# Patient Record
Sex: Male | Born: 1950 | Race: White | Hispanic: No | Marital: Married
Health system: Midwestern US, Community
[De-identification: ages and names within clinical notes are randomized; demographics above are authoritative.]

## PROBLEM LIST (undated history)

## (undated) DIAGNOSIS — K635 Polyp of colon: Secondary | ICD-10-CM

## (undated) DIAGNOSIS — K5790 Diverticulosis of intestine, part unspecified, without perforation or abscess without bleeding: Secondary | ICD-10-CM

## (undated) DIAGNOSIS — R945 Abnormal results of liver function studies: Secondary | ICD-10-CM

## (undated) DIAGNOSIS — E119 Type 2 diabetes mellitus without complications: Secondary | ICD-10-CM

## (undated) DIAGNOSIS — E785 Hyperlipidemia, unspecified: Secondary | ICD-10-CM

## (undated) DIAGNOSIS — E1136 Type 2 diabetes mellitus with diabetic cataract: Principal | ICD-10-CM

## (undated) DIAGNOSIS — H2512 Age-related nuclear cataract, left eye: Secondary | ICD-10-CM

## (undated) HISTORY — PX: TONSILLECTOMY: SUR1361

---

## 2000-07-10 HISTORY — PX: COLONOSCOPY: SHX174

## 2002-08-19 HISTORY — PX: COLONOSCOPY: SHX174

## 2006-07-02 ENCOUNTER — Ambulatory Visit: Payer: Self-pay | Admitting: Internal Medicine

## 2007-11-26 ENCOUNTER — Ambulatory Visit: Payer: Self-pay | Admitting: Gastroenterology

## 2008-05-18 IMAGING — US ABDOMEN ULTRASOUND
1 series · 17 of 25 positions shown · non-contrast
Comparison: none

REASON FOR EXAM: abnormal LFT
COMMENTS:

[Series 1: abdomen ultrasound · 17 of 51 slices shown]
[im 1/51]
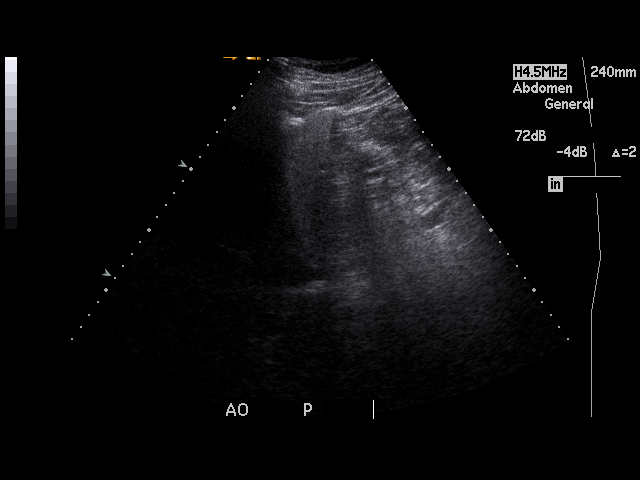
[im 5/51]
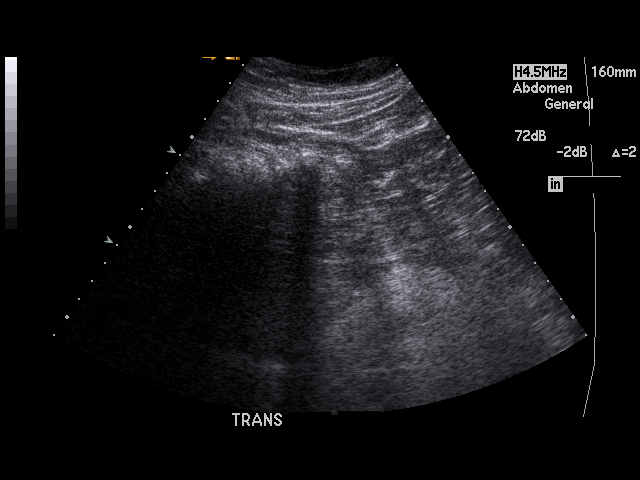
[im 7/51]
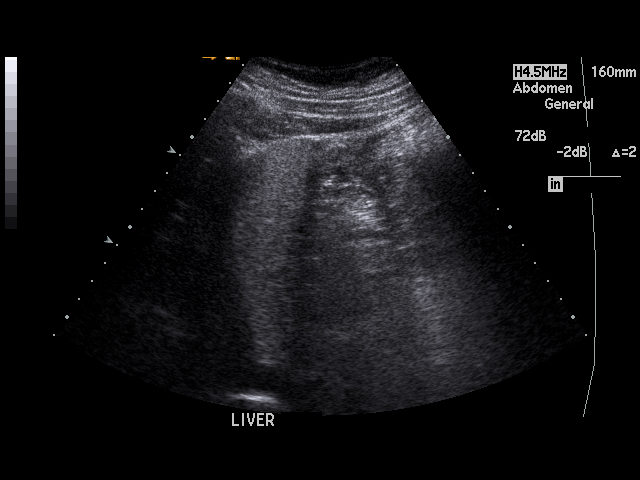
[im 11/51]
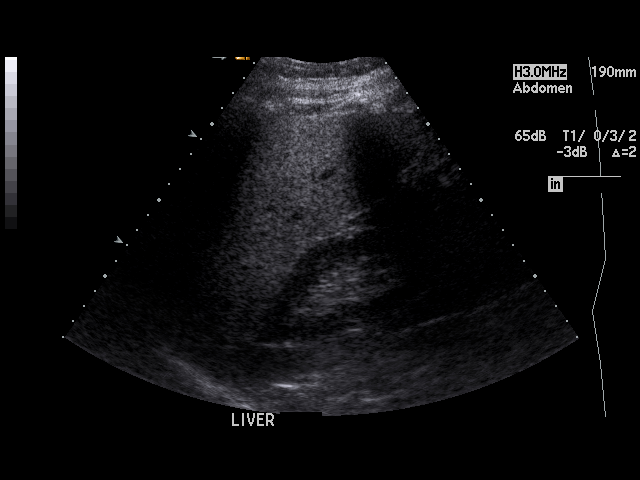
[im 13/51]
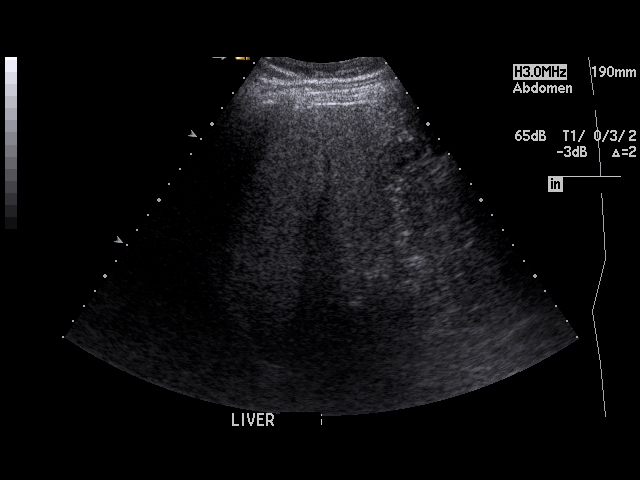
[im 17/51]
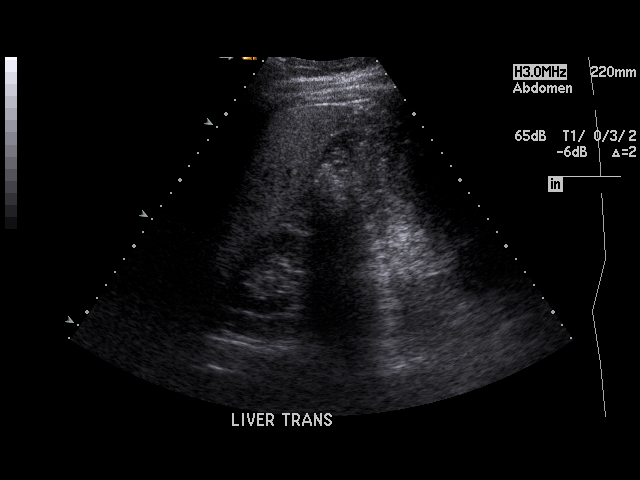
[im 19/51]
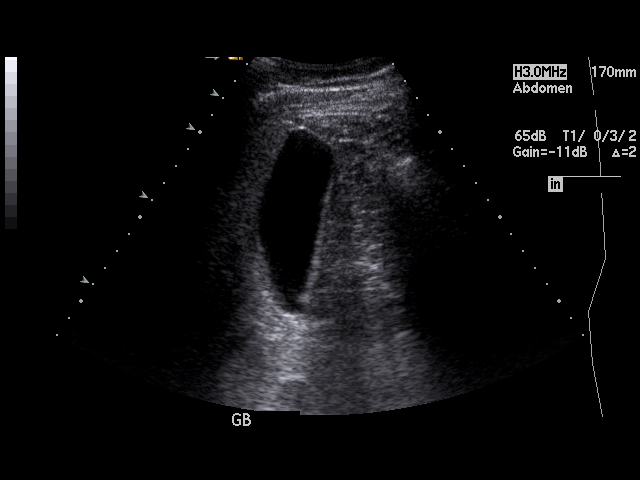
[im 23/51]
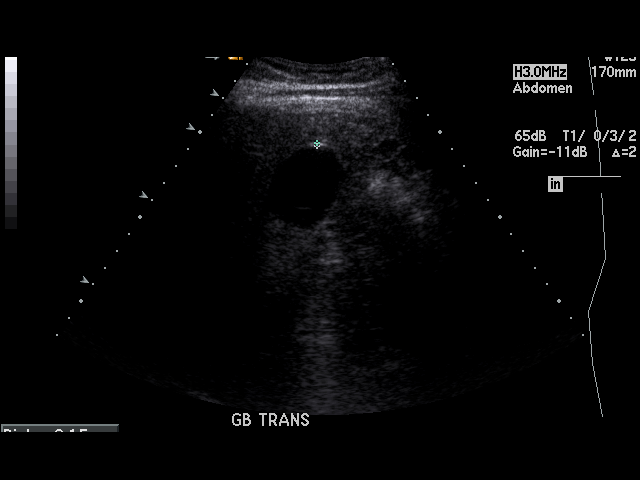
[im 26/51]
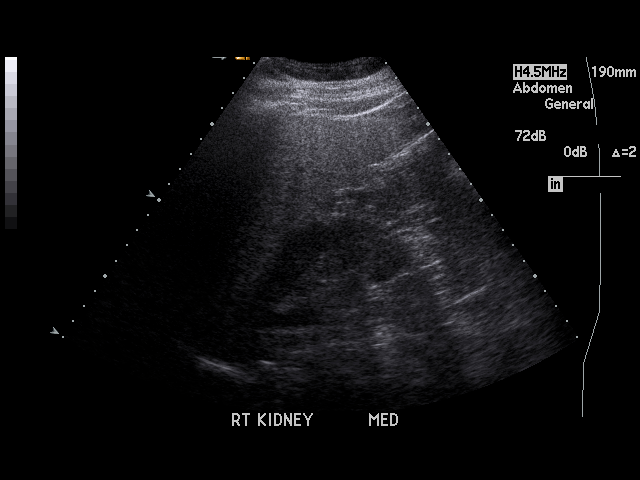
[im 28/51]
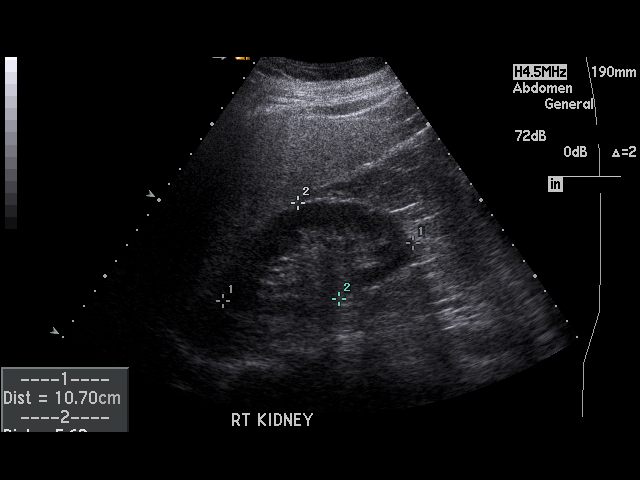
[im 32/51]
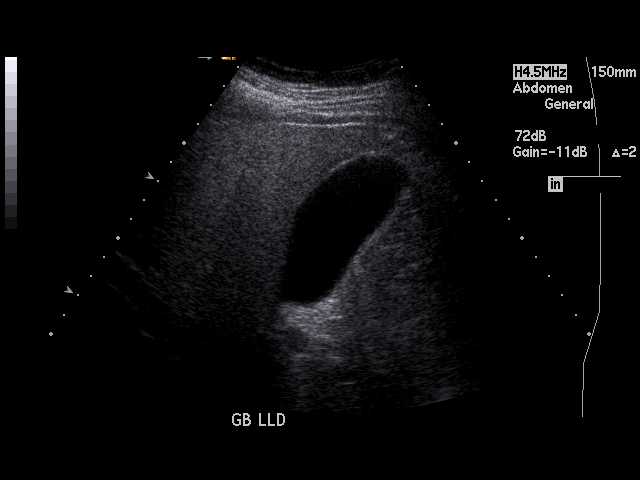
[im 34/51]
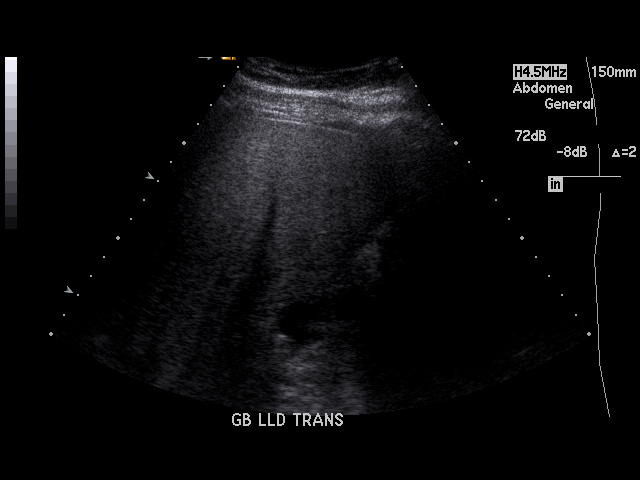
[im 38/51]
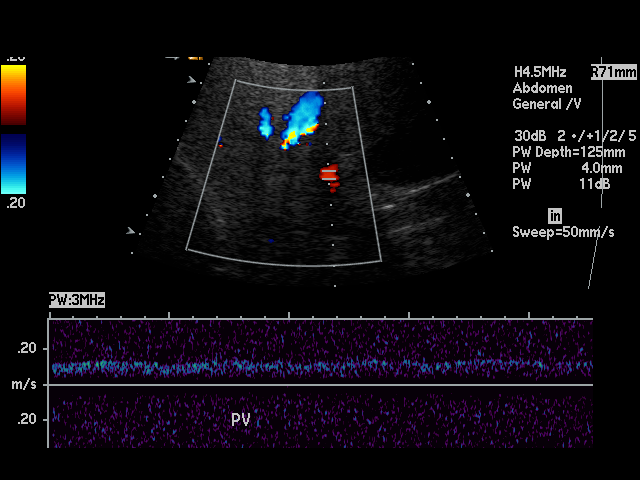
[im 40/51]
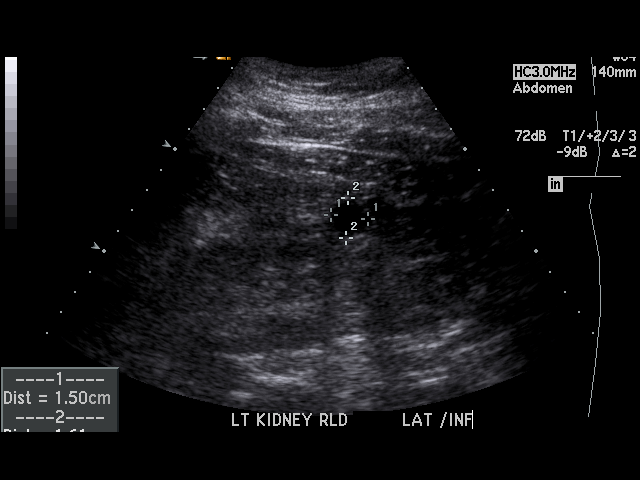
[im 44/51]
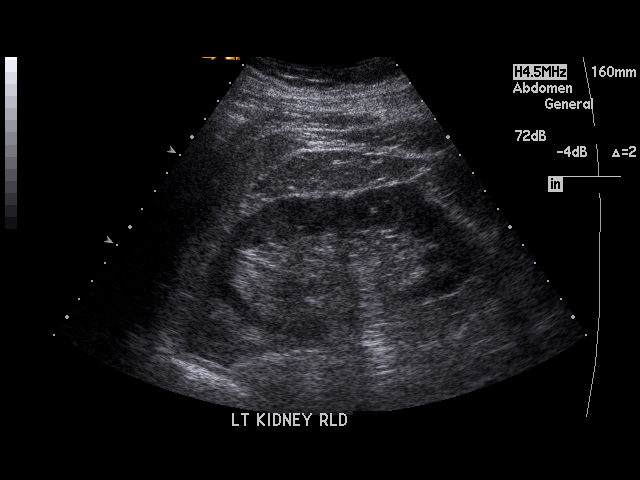
[im 46/51]
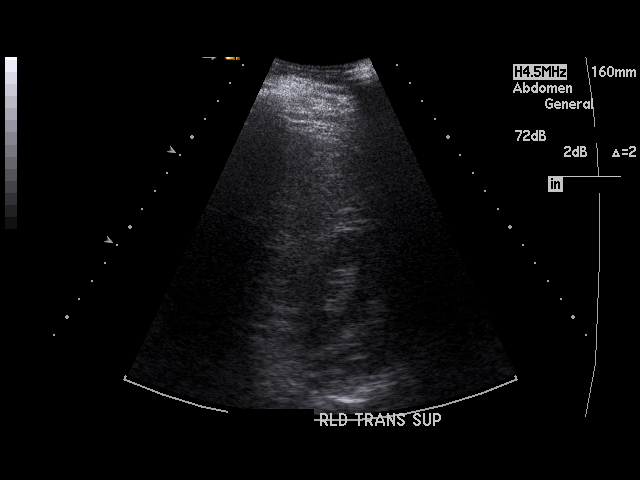
[im 51/51]
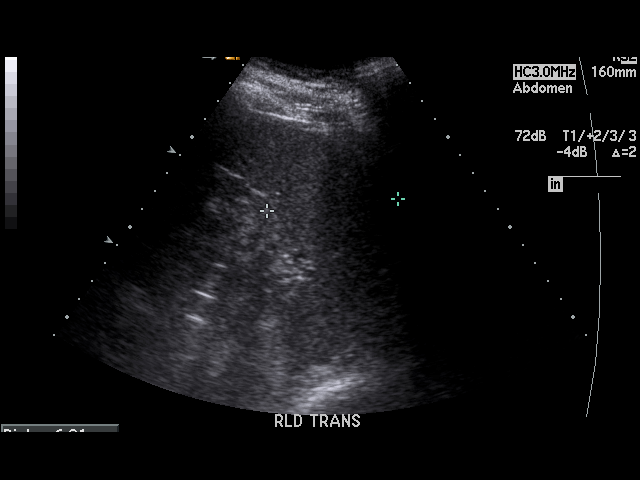

[17 of 25 positions shown; findings below may reference images not displayed]

PROCEDURE:     US  - US ABDOMEN GENERAL SURVEY  - July 02, 2006  [DATE]

RESULT:     There is fatty replacement of the liver.  The pancreas is not
visualized.  The gallbladder is unremarkable.  The common bile duct wall is
normal in caliber at 1.5 mm.  The common bile duct measures 5.8 mm.  The
abdominal aorta is normal. There is no hydronephrosis.  A simple cyst
measuring 1.6 cm is noted in the LEFT kidney.
IMPRESSION: 1)Diffuse echogenicity of the liver.  This could be seen with fatty
replacement.  Hepatocellular disease could also present in this fashion.

## 2018-06-01 ENCOUNTER — Encounter: Payer: Self-pay | Admitting: *Deleted

## 2018-06-02 ENCOUNTER — Ambulatory Visit: Payer: Medicare Other | Admitting: Certified Registered"

## 2018-06-02 ENCOUNTER — Ambulatory Visit
Admission: RE | Admit: 2018-06-02 | Discharge: 2018-06-02 | Disposition: A | Discharge status: Home / Self Care | Payer: Medicare Other | Attending: Gastroenterology | Admitting: Gastroenterology

## 2018-06-02 ENCOUNTER — Encounter
Admission: RE | Disposition: A | Discharge status: Home / Self Care | Payer: Self-pay | Source: Home / Self Care | Attending: Gastroenterology

## 2018-06-02 DIAGNOSIS — Z888 Allergy status to other drugs, medicaments and biological substances status: Secondary | ICD-10-CM | POA: Insufficient documentation

## 2018-06-02 DIAGNOSIS — Z7984 Long term (current) use of oral hypoglycemic drugs: Secondary | ICD-10-CM | POA: Diagnosis not present

## 2018-06-02 DIAGNOSIS — Z7982 Long term (current) use of aspirin: Secondary | ICD-10-CM | POA: Diagnosis not present

## 2018-06-02 DIAGNOSIS — K5730 Diverticulosis of large intestine without perforation or abscess without bleeding: Secondary | ICD-10-CM | POA: Diagnosis not present

## 2018-06-02 DIAGNOSIS — K640 First degree hemorrhoids: Secondary | ICD-10-CM | POA: Insufficient documentation

## 2018-06-02 DIAGNOSIS — Z1211 Encounter for screening for malignant neoplasm of colon: Secondary | ICD-10-CM | POA: Diagnosis not present

## 2018-06-02 DIAGNOSIS — D122 Benign neoplasm of ascending colon: Secondary | ICD-10-CM | POA: Diagnosis not present

## 2018-06-02 DIAGNOSIS — E119 Type 2 diabetes mellitus without complications: Secondary | ICD-10-CM | POA: Insufficient documentation

## 2018-06-02 DIAGNOSIS — K635 Polyp of colon: Secondary | ICD-10-CM | POA: Insufficient documentation

## 2018-06-02 DIAGNOSIS — D123 Benign neoplasm of transverse colon: Secondary | ICD-10-CM | POA: Diagnosis not present

## 2018-06-02 DIAGNOSIS — E785 Hyperlipidemia, unspecified: Secondary | ICD-10-CM | POA: Diagnosis not present

## 2018-06-02 DIAGNOSIS — Z79899 Other long term (current) drug therapy: Secondary | ICD-10-CM | POA: Insufficient documentation

## 2018-06-02 DIAGNOSIS — Z86010 Personal history of colonic polyps: Secondary | ICD-10-CM | POA: Diagnosis not present

## 2018-06-02 HISTORY — DX: Type 2 diabetes mellitus without complications: E11.9

## 2018-06-02 HISTORY — DX: Diverticulosis of intestine, part unspecified, without perforation or abscess without bleeding: K57.90

## 2018-06-02 HISTORY — DX: Hyperlipidemia, unspecified: E78.5

## 2018-06-02 HISTORY — DX: Abnormal results of liver function studies: R94.5

## 2018-06-02 HISTORY — DX: Polyp of colon: K63.5

## 2018-06-02 HISTORY — PX: COLONOSCOPY WITH PROPOFOL: SHX5780

## 2018-06-02 LAB — GLUCOSE, CAPILLARY: Glucose-Capillary: 240 mg/dL — ABNORMAL HIGH (ref 70–99)

## 2018-06-02 SURGERY — COLONOSCOPY WITH PROPOFOL
Anesthesia: General

## 2018-06-02 MED ORDER — PROPOFOL 500 MG/50ML IV EMUL
INTRAVENOUS | Status: DC | PRN
  Administered 2018-06-02: 155 ug/kg/min via INTRAVENOUS

## 2018-06-02 MED ORDER — PROPOFOL 10 MG/ML IV BOLUS
INTRAVENOUS | Status: DC | PRN
  Administered 2018-06-02 (×2): 50 mg via INTRAVENOUS

## 2018-06-02 MED ORDER — SODIUM CHLORIDE 0.9 % IV SOLN
INTRAVENOUS | Status: DC
  Administered 2018-06-02: 1000 mL via INTRAVENOUS

## 2018-06-02 MED ORDER — PROPOFOL 500 MG/50ML IV EMUL
INTRAVENOUS | Status: AC
  Filled 2018-06-02: qty 50

## 2018-06-02 MED ORDER — PROPOFOL 10 MG/ML IV BOLUS
INTRAVENOUS | Status: AC
  Filled 2018-06-02: qty 20

## 2018-06-02 NOTE — Anesthesia Post-op Follow-up Note (Signed)
Anesthesia QCDR form completed.        

## 2018-06-02 NOTE — H&P (Signed)
Outpatient short stay form Pre-procedure 06/02/2018 10:25 AM Lollie Sails MD  Primary Physician: Dr. Emily Filbert  Reason for visit: Colonoscopy  History of present illness: Patient is a 67 year old male presenting today for colonoscopy.  His last procedure was 01/14/2013 with removal of several polyps at that time.  He is presenting today for for a repeat colonoscopy.  He tolerated his prep.  He takes no aspirin or blood thinning agent within the past several weeks.    Current Facility-Administered Medications:  .  0.9 %  sodium chloride infusion, , Intravenous, Continuous, Lollie Sails, MD, Last Rate: 20 mL/hr at 06/02/18 1005, 1,000 mL at 06/02/18 1005  Medications Prior to Admission  Medication Sig Dispense Refill Last Dose  . ALPRAZolam (XANAX) 0.25 MG tablet Take 0.25 mg by mouth as needed for anxiety.   Past Week at Unknown time  . aspirin 325 MG tablet Take 325 mg by mouth as needed.   Past Week at Unknown time  . glimepiride (AMARYL) 2 MG tablet Take 2 mg by mouth daily with breakfast.   Past Week at Unknown time  . lisinopril (PRINIVIL,ZESTRIL) 20 MG tablet Take 20 mg by mouth daily.   Past Week at Unknown time  . Multiple Vitamins-Minerals (CENTRUM SILVER PO) Take by mouth daily.   Past Week at Unknown time  . Omega-3 Fatty Acids (OMEGA-3 FISH OIL PO) Take by mouth daily.   Past Week at Unknown time  . dapagliflozin propanediol (FARXIGA) 10 MG TABS tablet Take 10 mg by mouth daily.   Not Taking at Unknown time     Allergies  Allergen Reactions  . Metformin And Related     Muscle pain   . Statins     Muscle pain      Past Medical History:  Diagnosis Date  . Abnormal LFTs   . Colon polyp    tubular adenoma  . Diabetes mellitus without complication ([place])   . Diverticulosis   . Hyperlipidemia     Review of systems:      Physical Exam    Heart and lungs: Regular rate and rhythm without rub or gallop, lungs are bilaterally clear.    HEENT:  Normocephalic atraumatic eyes are anicteric    Other:    Pertinant exam for procedure: Soft nontender nondistended bowel sounds positive normoactive    Planned proceedures: Colonoscopy and indicated procedures. I have discussed the risks benefits and complications of procedures to include not limited to bleeding, infection, perforation and the risk of sedation and the patient wishes to proceed.    Lollie Sails, MD Gastroenterology 06/02/2018  10:25 AM

## 2018-06-02 NOTE — Anesthesia Postprocedure Evaluation (Signed)
Anesthesia Post Note  Patient: George Scott.  Procedure(s) Performed: COLONOSCOPY WITH PROPOFOL (N/A )  Patient location during evaluation: Endoscopy Anesthesia Type: General Level of consciousness: awake and alert and oriented Pain management: pain level controlled Vital Signs Assessment: post-procedure vital signs reviewed and stable Respiratory status: spontaneous breathing, nonlabored ventilation and respiratory function stable Cardiovascular status: blood pressure returned to baseline and stable Postop Assessment: no signs of nausea or vomiting Anesthetic complications: no     Last Vitals:  Vitals:   06/02/18 1111 06/02/18 1121  BP: 110/64 125/70  Pulse: 64 (!) 59  Resp: 16 12  Temp: (!) 36.2 C   SpO2: 99% 95%    Last Pain:  Vitals:   06/02/18 1121  TempSrc:   PainSc: 0-No pain                 George Scott

## 2018-06-02 NOTE — Transfer of Care (Signed)
Immediate Anesthesia Transfer of Care Note  Patient: George Scott.  Procedure(s) Performed: COLONOSCOPY WITH PROPOFOL (N/A )  Patient Location: PACU  Anesthesia Type:General  Level of Consciousness: awake, alert  and oriented  Airway & Oxygen Therapy: Patient Spontanous Breathing and Patient connected to nasal cannula oxygen  Post-op Assessment: Report given to RN  Post vital signs: Reviewed and stable  Last Vitals:  Vitals Value Taken Time  BP    Temp    Pulse 65 06/02/2018 11:12 AM  Resp 14 06/02/2018 11:12 AM  SpO2 98 % 06/02/2018 11:12 AM  Vitals shown include unvalidated device data.  Last Pain:  Vitals:   06/02/18 0948  TempSrc: Tympanic  PainSc: 0-No pain         Complications: No apparent anesthesia complications

## 2018-06-02 NOTE — Anesthesia Preprocedure Evaluation (Addendum)
Anesthesia Evaluation  Patient identified by MRN, date of birth, ID band Patient awake    Reviewed: Allergy & Precautions, NPO status , Patient's Chart, lab work & pertinent test results  History of Anesthesia Complications Negative for: history of anesthetic complications  Airway Mallampati: II  TM Distance: >3 FB Neck ROM: Full    Dental no notable dental hx.    Pulmonary neg sleep apnea, neg COPD, former smoker,    breath sounds clear to auscultation- rhonchi (-) wheezing      Cardiovascular Exercise Tolerance: Good hypertension, Pt. on medications (-) CAD, (-) Past MI, (-) Cardiac Stents and (-) CABG  Rhythm:Regular Rate:Normal - Systolic murmurs and - Diastolic murmurs    Neuro/Psych neg Seizures negative neurological ROS  negative psych ROS   GI/Hepatic negative GI ROS, Neg liver ROS,   Endo/Other  diabetes, Oral Hypoglycemic Agents  Renal/GU negative Renal ROS     Musculoskeletal negative musculoskeletal ROS (+)   Abdominal (+) - obese,   Peds  Hematology negative hematology ROS (+)   Anesthesia Other Findings Past Medical History: No date: Abnormal LFTs No date: Colon polyp     Comment:  tubular adenoma No date: Diabetes mellitus without complication ([facility]) No date: Diverticulosis No date: Hyperlipidemia   Reproductive/Obstetrics                             Anesthesia Physical Anesthesia Plan  ASA: II  Anesthesia Plan: General   Post-op Pain Management:    Induction: Intravenous  PONV Risk Score and Plan: 1 and Propofol infusion  Airway Management Planned: Natural Airway  Additional Equipment:   Intra-op Plan:   Post-operative Plan:   Informed Consent: I have reviewed the patients History and Physical, chart, labs and discussed the procedure including the risks, benefits and alternatives for the proposed anesthesia with the patient or authorized representative  who has indicated his/her understanding and acceptance.   Dental advisory given  Plan Discussed with: CRNA and Anesthesiologist  Anesthesia Plan Comments:        Anesthesia Quick Evaluation

## 2018-06-02 NOTE — Op Note (Signed)
[facility] Gastroenterology Patient Name: George Scott Procedure Date: 06/02/2018 10:28 AM MRN: [id number] Account #: [id number] Date of Birth: [birth year] Admit Type: Outpatient Age: 67 Room: [facility] ENDO ROOM 3 Gender: Male Note Status: Finalized Procedure:            Colonoscopy Indications:          Personal history of colonic polyps Providers:            Lollie Sails, MD Referring MD:         Rusty Aus, MD (Referring MD) Medicines:            Monitored Anesthesia Care Complications:        No immediate complications. Procedure:            Pre-Anesthesia Assessment:                       - ASA Grade Assessment: II - A patient with mild                        systemic disease.                       After obtaining informed consent, the colonoscope was                        passed under direct vision. Throughout the procedure,                        the patient's blood pressure, pulse, and oxygen                        saturations were monitored continuously. The                        Colonoscope was introduced through the anus and                        advanced to the the terminal ileum. The colonoscopy was                        performed without difficulty. The patient tolerated the                        procedure well. The quality of the bowel preparation                        was good. Findings:      Many medium-mouthed diverticula were found in the sigmoid colon,       descending colon, transverse colon and ascending colon.      A 2 mm polyp was found in the distal sigmoid colon. The polyp was       sessile. The polyp was removed with a cold biopsy forceps. Resection and       retrieval were complete.      A 3 mm polyp was found in the ascending colon. The polyp was sessile.       The polyp was removed with a cold biopsy forceps. Resection and       retrieval were complete.      A 2 mm polyp was found in the distal transverse colon. The  polyp was  sessile. The polyp was removed with a cold biopsy forceps. Resection and       retrieval were complete.      Non-bleeding internal hemorrhoids were found during retroflexion. The       hemorrhoids were small and Grade I (internal hemorrhoids that do not       prolapse).      The digital rectal exam was normal. Impression:           - Diverticulosis in the sigmoid colon, in the                        descending colon, in the transverse colon and in the                        ascending colon.                       - One 2 mm polyp in the distal sigmoid colon, removed                        with a cold biopsy forceps. Resected and retrieved.                       - One 3 mm polyp in the ascending colon, removed with a                        cold biopsy forceps. Resected and retrieved.                       - One 2 mm polyp in the distal transverse colon,                        removed with a cold biopsy forceps. Resected and                        retrieved.                       - Non-bleeding internal hemorrhoids. Recommendation:       - Discharge patient to home.                       - Soft diet today, then advance as tolerated to advance                        diet as tolerated.                       - Telephone GI clinic for pathology results in 1 week. Procedure Code(s):    --- Professional ---                       [id number], Colonoscopy, flexible; with biopsy, single or                        multiple Diagnosis Code(s):    --- Professional ---                       K64.0, First degree hemorrhoids  D12.5, Benign neoplasm of sigmoid colon                       D12.2, Benign neoplasm of ascending colon                       D12.3, Benign neoplasm of transverse colon (hepatic                        flexure or splenic flexure)                       Z86.010, Personal history of colonic polyps                       K57.30, Diverticulosis of large  intestine without                        perforation or abscess without bleeding CPT copyright 2018 American Medical Association. All rights reserved. The codes documented in this report are preliminary and upon coder review may  be revised to meet current compliance requirements. Lollie Sails, MD 06/02/2018 11:07:52 AM This report has been signed electronically. Number of Addenda: 0 Note Initiated On: 06/02/2018 10:28 AM Scope Withdrawal Time: 0 hours 10 minutes 32 seconds  Total Procedure Duration: 0 hours 19 minutes 32 seconds       [facility]

## 2018-06-04 ENCOUNTER — Encounter: Payer: Self-pay | Admitting: Gastroenterology

## 2018-06-04 LAB — SURGICAL PATHOLOGY

## 2018-10-20 ENCOUNTER — Other Ambulatory Visit: Payer: Self-pay | Admitting: Gastroenterology

## 2018-10-20 DIAGNOSIS — R748 Abnormal levels of other serum enzymes: Secondary | ICD-10-CM

## 2018-10-27 ENCOUNTER — Ambulatory Visit
Admission: RE | Admit: 2018-10-27 | Discharge: 2018-10-27 | Disposition: A | Discharge status: Home / Self Care | Payer: Medicare Other | Source: Ambulatory Visit | Attending: Gastroenterology | Admitting: Gastroenterology

## 2018-10-27 ENCOUNTER — Other Ambulatory Visit: Payer: Self-pay

## 2018-10-27 DIAGNOSIS — R748 Abnormal levels of other serum enzymes: Secondary | ICD-10-CM | POA: Diagnosis present

## 2019-06-23 ENCOUNTER — Ambulatory Visit: Payer: Medicare Other

## 2019-11-10 ENCOUNTER — Ambulatory Visit: Payer: Medicare Other | Admitting: Urology

## 2019-11-10 ENCOUNTER — Other Ambulatory Visit: Payer: Self-pay

## 2019-11-10 ENCOUNTER — Encounter: Payer: Self-pay | Admitting: Urology

## 2019-11-10 VITALS — BP 117/69 | HR 68 | Ht 68.0 in | Wt 176.0 lb

## 2019-11-10 DIAGNOSIS — N529 Male erectile dysfunction, unspecified: Secondary | ICD-10-CM

## 2019-11-10 MED ORDER — TADALAFIL 10 MG PO TABS: 10.0000 mg | ORAL_TABLET | Freq: Every day | ORAL | 11 refills | Status: AC

## 2019-11-10 NOTE — Patient Instructions (Addendum)
Tadalafil tablets (Cialis) What is this medicine? TADALAFIL (tah DA la fil) is used to treat erection problems in men. It is also used for enlargement of the prostate gland in men, a condition called benign prostatic hyperplasia or BPH. This medicine improves urine flow and reduces BPH symptoms. This medicine can also treat both erection problems and BPH when they occur together. This medicine may be used for other purposes; ask your health care provider or pharmacist if you have questions. COMMON BRAND NAME(S): Adcirca, ALYQ, Cialis What should I tell my health care provider before I take this medicine? They need to know if you have any of these conditions:  bleeding disorders  eye or vision problems, including a rare inherited eye disease called retinitis pigmentosa  anatomical deformation of the penis, Peyronie's disease, or history of priapism (painful and prolonged erection)  heart disease, angina, a history of heart attack, irregular heart beats, or other heart problems  high or low blood pressure  history of blood diseases, like sickle cell anemia or leukemia  history of stomach bleeding  kidney disease  liver disease  stroke  an unusual or allergic reaction to tadalafil, other medicines, foods, dyes, or preservatives  pregnant or trying to get pregnant  breast-feeding How should I use this medicine? Take this medicine by mouth with a glass of water. Follow the directions on the prescription label. You may take this medicine with or without meals. When this medicine is used for erection problems, your doctor may prescribe it to be taken once daily or as needed. If you are taking the medicine as needed, you may be able to have sexual activity 30 minutes after taking it and for up to 36 hours after taking it. Whether you are taking the medicine as needed or once daily, you should not take more than one dose per day. If you are taking this medicine for symptoms of benign  prostatic hyperplasia (BPH) or to treat both BPH and an erection problem, take the dose once daily at about the same time each day. Do not take your medicine more often than directed. Talk to your pediatrician regarding the use of this medicine in children. Special care may be needed. Overdosage: If you think you have taken too much of this medicine contact a poison control center or emergency room at once. NOTE: This medicine is only for you. Do not share this medicine with others. What if I miss a dose? If you are taking this medicine as needed for erection problems, this does not apply. If you miss a dose while taking this medicine once daily for an erection problem, benign prostatic hyperplasia, or both, take it as soon as you remember, but do not take more than one dose per day. What may interact with this medicine? Do not take this medicine with any of the following medications:  nitrates like amyl nitrite, isosorbide dinitrate, isosorbide mononitrate, nitroglycerin  other medicines for erectile dysfunction like avanafil, sildenafil, vardenafil  other tadalafil products (Adcirca)  riociguat This medicine may also interact with the following medications:  certain drugs for high blood pressure  certain drugs for the treatment of HIV infection or AIDS  certain drugs used for fungal or yeast infections, like fluconazole, itraconazole, ketoconazole, and voriconazole  certain drugs used for seizures like carbamazepine, phenytoin, and phenobarbital  grapefruit juice  macrolide antibiotics like clarithromycin, erythromycin, troleandomycin  medicines for prostate problems  rifabutin, rifampin or rifapentine This list may not describe all possible interactions. Give your   health care provider a list of all the medicines, herbs, non-prescription drugs, or dietary supplements you use. Also tell them if you smoke, drink alcohol, or use illegal drugs. Some items may interact with your  medicine. What should I watch for while using this medicine? If you notice any changes in your vision while taking this drug, call your doctor or health care professional as soon as possible. Stop using this medicine and call your health care provider right away if you have a loss of sight in one or both eyes. Contact your doctor or health care professional right away if the erection lasts longer than 4 hours or if it becomes painful. This may be a sign of serious problem and must be treated right away to prevent permanent damage. If you experience symptoms of nausea, dizziness, chest pain or arm pain upon initiation of sexual activity after taking this medicine, you should refrain from further activity and call your doctor or health care professional as soon as possible. Do not drink alcohol to excess (examples, 5 glasses of wine or 5 shots of whiskey) when taking this medicine. When taken in excess, alcohol can increase your chances of getting a headache or getting dizzy, increasing your heart rate or lowering your blood pressure. Using this medicine does not protect you or your partner against HIV infection (the virus that causes AIDS) or other sexually transmitted diseases. What side effects may I notice from receiving this medicine? Side effects that you should report to your doctor or health care professional as soon as possible:  allergic reactions like skin rash, itching or hives, swelling of the face, lips, or tongue  breathing problems  changes in hearing  changes in vision  chest pain  fast, irregular heartbeat  prolonged or painful erection  seizures Side effects that usually do not require medical attention (report to your doctor or health care professional if they continue or are bothersome):  back pain  dizziness  flushing  headache  indigestion  muscle aches  nausea  stuffy or runny nose This list may not describe all possible side effects. Call your doctor  for medical advice about side effects. You may report side effects to FDA at 1-800-FDA-1088. Where should I keep my medicine? Keep out of the reach of children. Store at room temperature between 15 and 30 degrees C (59 and 86 degrees F). Throw away any unused medicine after the expiration date. NOTE: This sheet is a summary. It may not cover all possible information. If you have questions about this medicine, talk to your doctor, pharmacist, or health care provider.  2020 Elsevier/Gold Standard (2013-10-08 13:15:49) Erectile Dysfunction Erectile dysfunction (ED) is the inability to get or keep an erection in order to have sexual intercourse. Erectile dysfunction may include:  Inability to get an erection.  Lack of enough hardness of the erection to allow penetration.  Loss of the erection before sex is finished. What are the causes? This condition may be caused by:  Certain medicines, such as: ? Pain relievers. ? Antihistamines. ? Antidepressants. ? Blood pressure medicines. ? Water pills (diuretics). ? Ulcer medicines. ? Muscle relaxants. ? Drugs.  Excessive drinking.  Psychological causes, such as: ? Anxiety. ? Depression. ? Sadness. ? Exhaustion. ? Performance fear. ? Stress.  Physical causes, such as: ? Artery problems. This may include diabetes, smoking, liver disease, or atherosclerosis. ? High blood pressure. ? Hormonal problems, such as low testosterone. ? Obesity. ? Nerve problems. This may include back or pelvic   injuries, diabetes mellitus, multiple sclerosis, or Parkinson disease. What are the signs or symptoms? Symptoms of this condition include:  Inability to get an erection.  Lack of enough hardness of the erection to allow penetration.  Loss of the erection before sex is finished.  Normal erections at some times, but with frequent unsatisfactory episodes.  Low sexual satisfaction in either partner due to erection problems.  A curved penis  occurring with erection. The curve may cause pain or the penis may be too curved to allow for intercourse.  Never having nighttime erections. How is this diagnosed? This condition is often diagnosed by:  Performing a physical exam to find other diseases or specific problems with the penis.  Asking you detailed questions about the problem.  Performing blood tests to check for diabetes mellitus or to measure hormone levels.  Performing other tests to check for underlying health conditions.  Performing an ultrasound exam to check for scarring.  Performing a test to check blood flow to the penis.  Doing a sleep study at home to measure nighttime erections. How is this treated? This condition may be treated by:  Medicine taken by mouth to help you achieve an erection (oral medicine).  Hormone replacement therapy to replace low testosterone levels.  Medicine that is injected into the penis. Your health care provider may instruct you how to give yourself these injections at home.  Vacuum pump. This is a pump with a ring on it. The pump and ring are placed on the penis and used to create pressure that helps the penis become erect.  Penile implant surgery. In this procedure, you may receive: ? An inflatable implant. This consists of cylinders, a pump, and a reservoir. The cylinders can be inflated with a fluid that helps to create an erection, and they can be deflated after intercourse. ? A semi-rigid implant. This consists of two silicone rubber rods. The rods provide some rigidity. They are also flexible, so the penis can both curve downward in its normal position and become straight for sexual intercourse.  Blood vessel surgery, to improve blood flow to the penis. During this procedure, a blood vessel from a different part of the body is placed into the penis to allow blood to flow around (bypass) damaged or blocked blood vessels.  Lifestyle changes, such as exercising more, losing  weight, and quitting smoking. Follow these instructions at home: Medicines   Take over-the-counter and prescription medicines only as told by your health care provider. Do not increase the dosage without first discussing it with your health care provider.  If you are using self-injections, perform injections as directed by your health care provider. Make sure to avoid any veins that are on the surface of the penis. After giving an injection, apply pressure to the injection site for 5 minutes. General instructions  Exercise regularly, as directed by your health care provider. Work with your health care provider to lose weight, if needed.  Do not use any products that contain nicotine or tobacco, such as cigarettes and e-cigarettes. If you need help quitting, ask your health care provider.  Before using a vacuum pump, read the instructions that come with the pump and discuss any questions with your health care provider.  Keep all follow-up visits as told by your health care provider. This is important. Contact a health care provider if:  You feel nauseous.  You vomit. Get help right away if:  You are taking oral or injectable medicines and you have an   erection that lasts longer than 4 hours. If your health care provider is unavailable, go to the nearest emergency room for evaluation. An erection that lasts much longer than 4 hours can result in permanent damage to your penis.  You have severe pain in your groin or abdomen.  You develop redness or severe swelling of your penis.  You have redness spreading up into your groin or lower abdomen.  You are unable to urinate.  You experience chest pain or a rapid heart beat (palpitations) after taking oral medicines. Summary  Erectile dysfunction (ED) is the inability to get or keep an erection during sexual intercourse. This problem can usually be treated successfully.  This condition is diagnosed based on a physical exam, your  symptoms, and tests to determine the cause. Treatment varies depending on the cause, and may include medicines, hormone therapy, surgery, or vacuum pump.  You may need follow-up visits to make sure that you are using your medicines or devices correctly.  Get help right away if you are taking or injecting medicines and you have an erection that lasts longer than 4 hours. This information is not intended to replace advice given to you by your health care provider. Make sure you discuss any questions you have with your health care provider. Document Revised: 05/02/2017 Document Reviewed: 06/05/2016 Elsevier Patient Education  2020 Elsevier Inc.  

## 2019-11-10 NOTE — Progress Notes (Signed)
° °  11/10/19 1:17 PM   Shelbie Proctor. [birth year] [id number]  CC: Erectile dysfunction  HPI: I saw George Scott in urology clinic today for erectile dysfunction.  He is a 69 year old male with diabetes 9 hemoglobin A1c 7.1, but has been as high as 10 in the past 2 years). HE presents with worsening erections.  He reports he has had trouble with erections for at least 25 years.  He has previously tried Viagra twice, but had side effects of colored vision, and he did not notice a significant difference in his erection.  He is unsure what dose this was.  He does not have any cardiac history or take nitrates for chest pain.  He quit smoking 30 years ago.  Recent testosterone on 10/26/2019 was normal at 382.  He is able to achieve erections sufficient for intercourse, however he reports his erections are not as firm as they used to be.  Shim score today in clinic is 18, indicating mild ED.   PMH: Past Medical History:  Diagnosis Date   Abnormal LFTs    Colon polyp    tubular adenoma   Diabetes mellitus without complication ([place])    Diverticulosis    Hyperlipidemia     Surgical History: Past Surgical History:  Procedure Laterality Date   COLONOSCOPY  08/19/2002   11/26/2007   COLONOSCOPY  07/10/2000   01/14/2013   COLONOSCOPY WITH PROPOFOL N/A 06/02/2018   Procedure: COLONOSCOPY WITH PROPOFOL;  Surgeon: Lollie Sails, MD;  Location: [facility] ENDOSCOPY;  Service: Endoscopy;  Laterality: N/A;   TONSILLECTOMY      Family History: Family History  Problem Relation Age of Onset   Coronary artery disease Mother    Throat cancer Mother    Diabetes Father    Coronary artery disease Father    Diabetes Sister     Social History:  reports that he has quit smoking. His smoking use included cigarettes. He has quit using smokeless tobacco.  His smokeless tobacco use included chew. He reports current alcohol use. He reports that he does not use drugs.  Physical Exam: BP 117/69     Pulse 68    Ht 5\' 8"  (1.727 m)    Wt 176 lb (79.8 kg)    BMI 26.76 kg/m    Constitutional:  Alert and oriented, No acute distress. Cardiovascular: No clubbing, cyanosis, or edema. Respiratory: Normal respiratory effort, no increased work of breathing. GI: Abdomen is soft, nontender, nondistended, no abdominal masses GU: Circumcised phallus with patent meatus, testicles 20 cc and descended bilaterally without masses  Assessment & Plan:   In summary, is a 69 year old male with diabetes and chronic erectile dysfunction for over 20 years, worsened over the last few years.  Testosterone is normal.  We discussed the importance of behavioral strategies including improve diabetes control, weight loss, exercise, and healthy eating.  I also recommended a trial of Cialis 10 mg daily.  We discussed this medication could also be taken on demand if he prefers.  We discussed the risk, benefits, and side effects at length.  We also discussed other options for erectile dysfunction including vacuum erection device, penile injections, and penile prosthesis.  Trial of Cialis 10 mg daily RTC 1 month for symptom check  Nickolas Madrid, MD 11/10/2019  [facility] [address], [place], [place] [address] [phone]

## 2019-12-15 ENCOUNTER — Ambulatory Visit: Payer: Self-pay | Admitting: Urology

## 2020-03-03 ENCOUNTER — Other Ambulatory Visit: Payer: Self-pay | Admitting: Internal Medicine

## 2020-03-03 ENCOUNTER — Ambulatory Visit: Payer: Medicare Other | Attending: Internal Medicine

## 2020-03-03 DIAGNOSIS — Z23 Encounter for immunization: Secondary | ICD-10-CM

## 2020-03-03 NOTE — Progress Notes (Signed)
   [id number]  Name:  Maddax Palinkas.    MRN: [id number] DOB: [birth year]  03/03/2020  Mr. Markos was observed post Covid-19 immunization for 15 minutes without incident. He was provided with Vaccine Information Sheet and instruction to access the V-Safe system.   Mr. Mchugh was instructed to call 911 with any severe reactions post vaccine: Marland Kitchen Difficulty breathing  . Swelling of face and throat  . A fast heartbeat  . A bad rash all over body  . Dizziness and weakness

## 2020-09-12 IMAGING — US US ABDOMEN COMPLETE WITH ELASTOGRAPHY
1 series · 13 of 25 positions shown · non-contrast
Comparison: 07/02/2006

CLINICAL DATA: Elevated liver enzymes.



[Series 1: us abdomen complete with elastography · 13 of 128 slices shown]
[im 1/128]
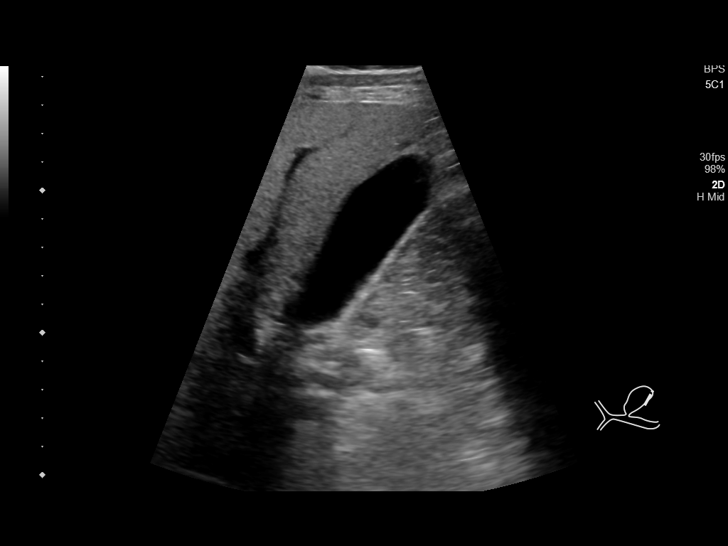
[im 11/128]
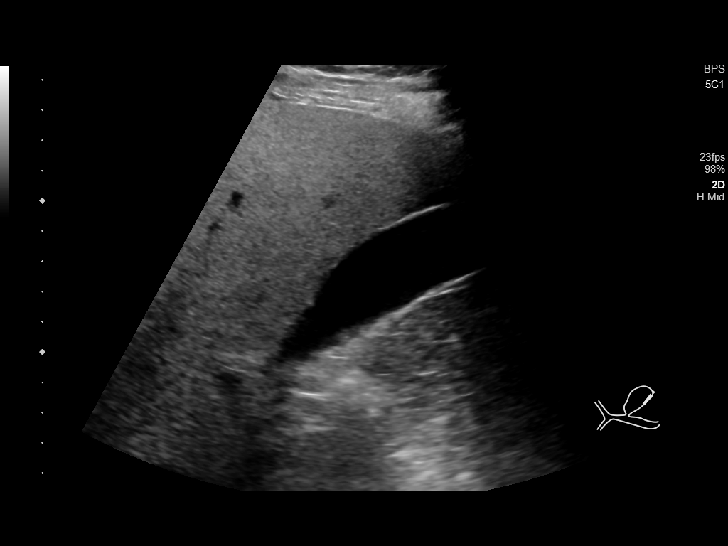
[im 22/128]
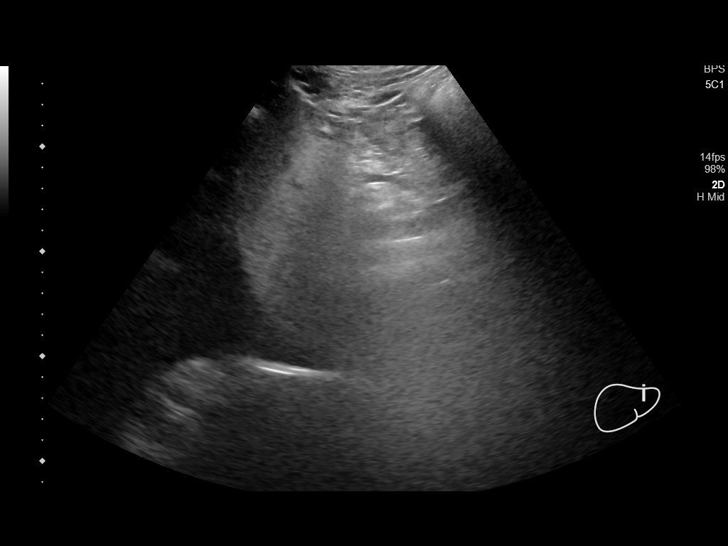
[im 32/128]
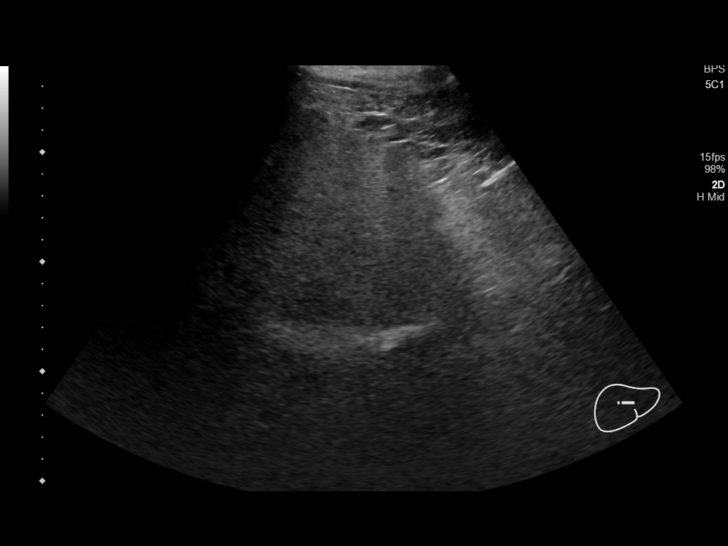
[im 43/128]
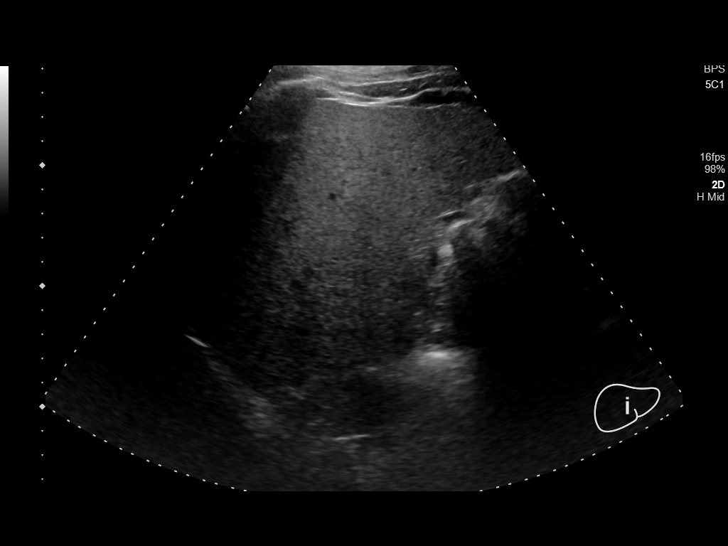
[im 53/128]
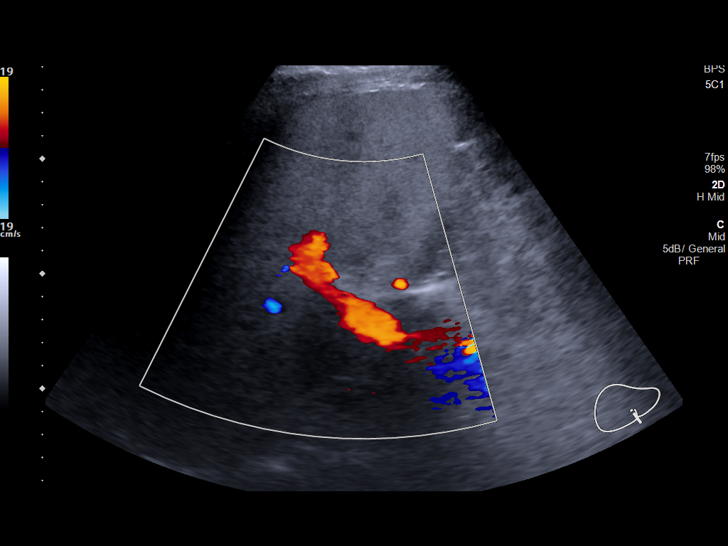
[im 64/128]
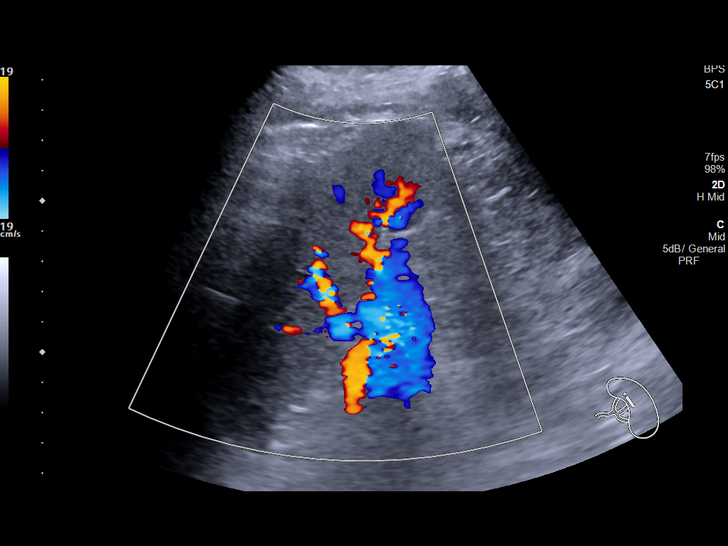
[im 75/128]
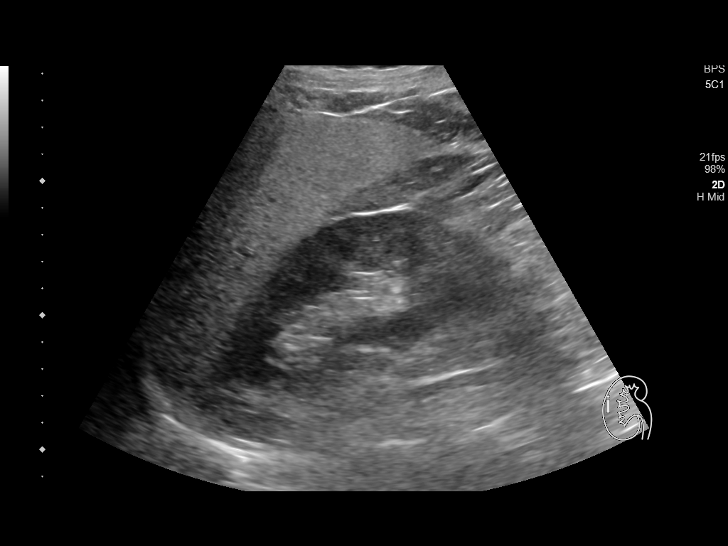
[im 85/128]
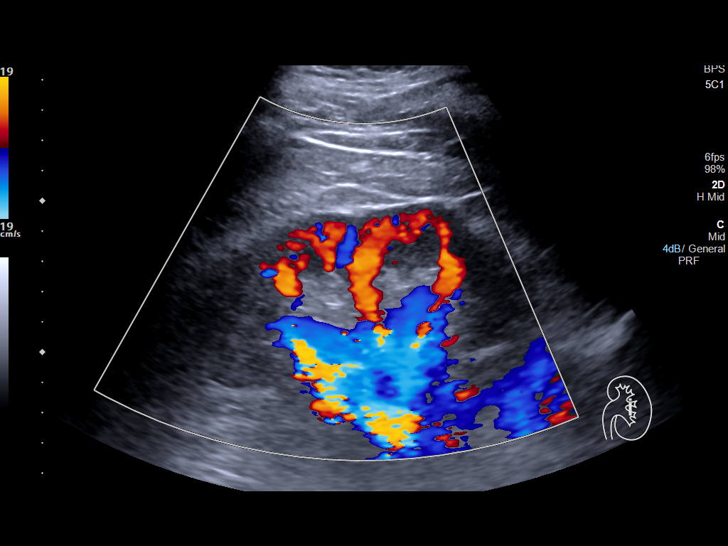
[im 96/128]
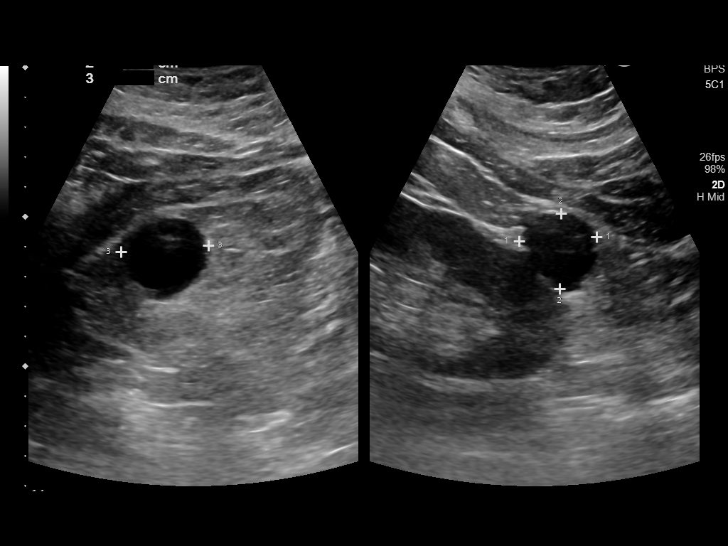
[im 106/128]
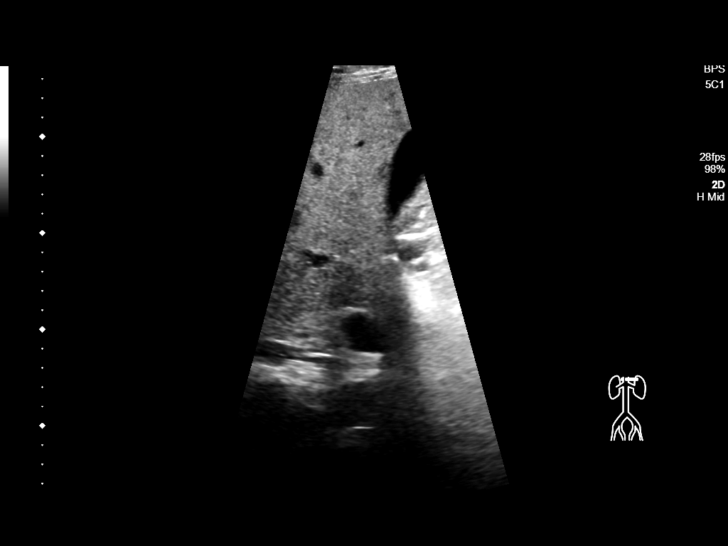
[im 117/128]
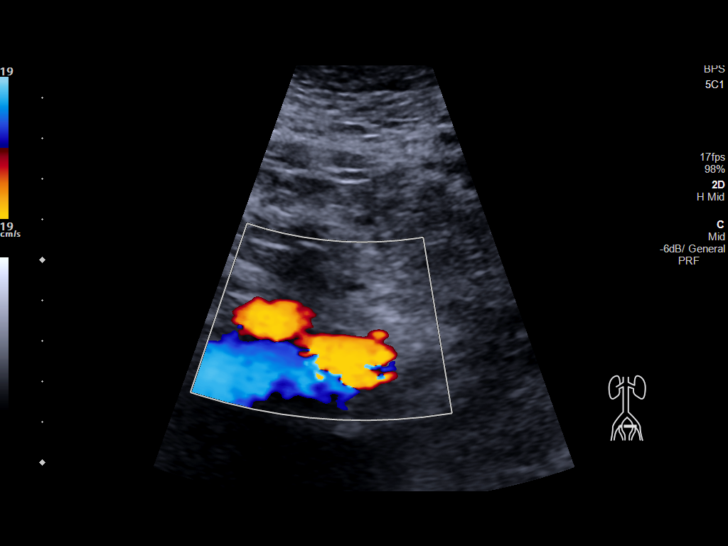
[im 128/128]
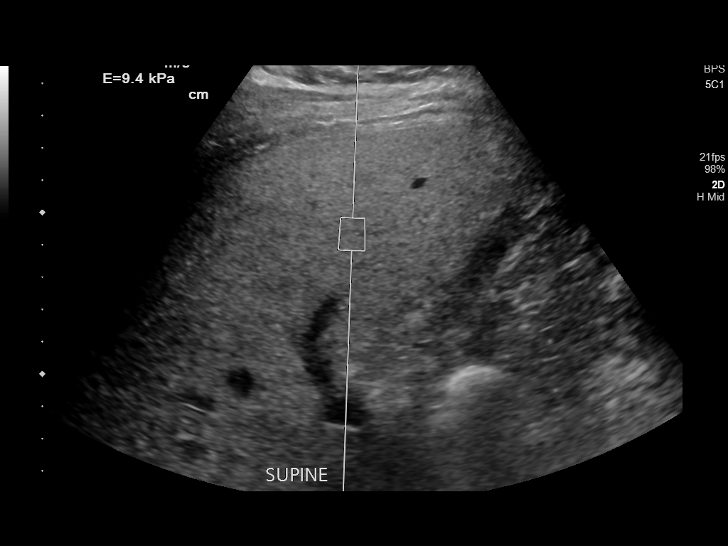

[13 of 25 positions shown; findings below may reference images not displayed]

FINDINGS: ULTRASOUND ABDOMEN

Gallbladder: No gallstones or wall thickening visualized. No
sonographic Murphy sign noted by sonographer.

Common bile duct: Diameter: 6 mm, within normal limits for age.

Liver: Diffusely increased echogenicity of the hepatic parenchyma is
again seen, consistent with hepatic steatosis. No hepatic mass
identified. Portal vein is patent on color Doppler imaging with
normal direction of blood flow towards the liver.

IVC: No abnormality visualized.

Pancreas: Not well visualized due to overlying bowel gas.

Spleen: Size and appearance within normal limits.

Right Kidney: Length: 11.5 cm. Echogenicity within normal limits. No
mass or hydronephrosis visualized.

Left Kidney: Length: 13.4 cm. Echogenicity within normal limits.
cm benign-appearing subcapsular cyst in lower pole. No mass or
hydronephrosis visualized.

Abdominal aorta: No aneurysm visualized.

Other findings: None.

ULTRASOUND HEPATIC ELASTOGRAPHY

Device: Siemens Helix VTQ

Patient position: Supine

Transducer 5C1

Number of measurements: 10

Hepatic segment:  8

Median velocity:   1.70 m/sec

IQR:

IQR/Median velocity ratio:

Corresponding Metavir fibrosis score:  F2 + some F3

Risk of fibrosis: Moderate

Limitations of exam: None

Please note that abnormal shear wave velocities may also be
identified in clinical settings other than with hepatic fibrosis,
such as: acute hepatitis, elevated right heart and central venous
pressures including use of beta blockers, Ourari disease
([place]), infiltrative processes such as
mastocytosis/amyloidosis/infiltrative tumor, extrahepatic
cholestasis, in the post-prandial state, and liver transplantation.
Correlation with patient history, laboratory data, and clinical
condition recommended.
IMPRESSION: ULTRASOUND ABDOMEN:

Diffuse hepatic steatosis, as seen on prior exam. No other
significant abnormality identified.

ULTRASOUND HEPATIC ELASTOGRAPHY:

Median hepatic shear wave velocity is calculated at 1.70 m/sec.

Corresponding Metavir fibrosis score is F2 + some F3.

Risk of fibrosis is Moderate.

Follow-up: Additional testing appropriate

## 2021-08-21 ENCOUNTER — Encounter: Attending: Neurology

## 2022-10-07 NOTE — Anesthesia Pre-Procedure Evaluation (Addendum)
Department of Anesthesiology  Preprocedure Note       Name:  Nathan Stone   Age:  72 y.o.  DOB:  Jan 05, 1951                                          MRN:  [id number]         Date:  10/07/2022      Surgeon: Moishe Spice):  Rudean Curt, MD    Procedure: Procedure(s):  EXTRACAPSULAR CATRACT REMOVAL WITH INSERTION OF INTRAOCULAR LENS IMPLANT LEFT EYE    Medications prior to admission:   Prior to Admission medications    Not on File       Current medications:    No current facility-administered medications for this encounter.     No current outpatient medications on file.       Allergies:  Not on File    Problem List:  There is no problem list on file for this patient.      Past Medical History:  No past medical history on file.    Past Surgical History:  No past surgical history on file.    Social History:    Social History     Tobacco Use   . Smoking status: Not on file   . Smokeless tobacco: Not on file   Substance Use Topics   . Alcohol use: Not on file                                Counseling given: Not Answered      Vital Signs (Current): There were no vitals filed for this visit.                                           BP Readings from Last 3 Encounters:   No data found for BP       NPO Status:                                                                                 BMI:   Wt Readings from Last 3 Encounters:   No data found for Wt     There is no height or weight on file to calculate BMI.    CBC: No results found for: "WBC", "RBC", "HGB", "HCT", "MCV", "RDW", "PLT"    CMP: No results found for: "NA", "K", "CL", "CO2", "BUN", "CREATININE", "GFRAA", "AGRATIO", "LABGLOM", "GLUCOSE", "GLU", "PROT", "CALCIUM", "BILITOT", "ALKPHOS", "AST", "ALT"    POC Tests: No results for input(s): "POCGLU", "POCNA", "POCK", "POCCL", "POCBUN", "POCHEMO", "POCHCT" in the last 72 hours.    Coags: No results found for: "PROTIME", "INR", "APTT"    HCG (If Applicable): No results found for: "PREGTESTUR", "PREGSERUM", "HCG",  "HCGQUANT"     ABGs: No results found for: "PHART", "PO2ART", "PCO2ART", "HCO3ART", "BEART", "O2SATART"     Type & Screen (If Applicable):  No results found for: "LABABO"  Drug/Infectious Status (If Applicable):  No results found for: "HIV", "HEPCAB"    COVID-19 Screening (If Applicable): No results found for: "COVID19"        Anesthesia Evaluation  Patient summary reviewed and Nursing notes reviewed  Airway:           Dental:          Pulmonary:Negative Pulmonary ROS   (+)           current smoker                           Cardiovascular:Negative CV ROS    (+) hypertension:, CAD:, hyperlipidemia                  Neuro/Psych:   Negative Neuro/Psych ROS  (+) TIA            GI/Hepatic/Renal: Neg GI/Hepatic/Renal ROS            Endo/Other: Negative Endo/Other ROS   (+) Diabetes.                 Abdominal:             Vascular: negative vascular ROS.         Other Findings:       Anesthesia Plan        Cecelia Byars, MD   10/07/2022

## 2022-10-16 NOTE — Other (Signed)
[facility]  SURGICAL PRE-ADMISSION INSTRUCTIONS    ARRIVAL  You will be called the day before your surgery with your expected arrival time.  Sign in at the front desk of the hospital.  You will be directed to the Surgical Waiting Room.  Please arrive at your scheduled appointment time.  You have been scheduled to arrive for your procedure one or two hours prior to the expected start time of your procedure.  Every effort will be made to minimize your wait but please be aware that unforeseen circumstances may affect our schedule.    EATING  DO NOT EAT OR DRINK ANYTHING AFTER MIDNIGHT ON THE EVENING BEFORE YOUR SURGERY OR ON THE DAY OF YOUR SURGERY except for your medications (as instructed) with a sip of water.  Do not use gum, mints or lozenges on the morning of your surgery.  Please do not smoke or chew tobacco before your surgery.    MEDICATIONS   AM meds    STOP THESE MEDICATIONS AT THE TIMES LISTED BELOW  none     DRIVING/TRANSPORATION  Have a responsible adult to drive you home from the hospital and to stay with you over night.  Please have them plan to remain in the hospital during your surgery.  Your surgery will not be done if you do not have a responsible adult to take you home and to stay with you.  If you have arranged for public transport, you must have a responsible adult to ride with you (who is not the driver).  You may not drive for 24 hours after anesthesia.    PREPARATION  If you have a Living WiIl/Advance Directive, please bring a copy with you to scan into your chart.   Please DO NOT wear makeup or nail polish  Please leave valuables at home,  DO NOT wear jewelry.  Wear loose, comfortable clothing that is large enough to cover a bulky dressing.    SPECIAL INSTRUCTIONS:  none    Reviewed above preoperative instructions and answered questions by phone interview    Patient:  Nathan Stone   Date:     Oct 16, 2022  Time:   2:01 PM    RN:  Clarita Crane, RN    Date:     Oct 16, 2022  Time:   2:01 PM

## 2022-10-20 NOTE — H&P (Signed)
H&P FROM PCP SCANNED INTO THE RECORD

## 2022-10-21 ENCOUNTER — Inpatient Hospital Stay: Payer: MEDICARE | Attending: Ophthalmology

## 2022-10-21 LAB — POCT GLUCOSE: POC Glucose: 129 mg/dL — ABNORMAL HIGH (ref 65–117)

## 2022-10-21 MED ORDER — MIDAZOLAM HCL 2 MG/2ML IJ SOLN
2 | INTRAMUSCULAR | Status: DC | PRN
Start: 2022-10-21 — End: 2022-10-21
  Administered 2022-10-21: 19:00:00 2 via INTRAVENOUS

## 2022-10-21 MED ORDER — PROPOFOL 200 MG/20ML IV EMUL
200 | INTRAVENOUS | Status: DC | PRN
Start: 2022-10-21 — End: 2022-10-21
  Administered 2022-10-21: 19:00:00 10 via INTRAVENOUS
  Administered 2022-10-21: 19:00:00 20 via INTRAVENOUS

## 2022-10-21 MED ORDER — PROPOFOL 200 MG/20ML IV EMUL
200 | INTRAVENOUS | Status: AC
Start: 2022-10-21 — End: ?

## 2022-10-21 MED ORDER — KETOROLAC TROMETHAMINE 0.5 % OP SOLN
0.5 | OPHTHALMIC | Status: DC | PRN
Start: 2022-10-21 — End: 2022-10-21
  Administered 2022-10-21: 19:00:00 1 [drp] via OPHTHALMIC

## 2022-10-21 MED ORDER — LIDOCAINE HCL (PF) 2 % IJ SOLN
2 | Freq: Once | INTRAMUSCULAR | Status: AC
Start: 2022-10-21 — End: 2022-10-21
  Administered 2022-10-21: 20:00:00 5 mL

## 2022-10-21 MED ORDER — TOBRAMYCIN-DEXAMETHASONE 0.3-0.1 % OP SUSP
Freq: Once | OPHTHALMIC | Status: AC
Start: 2022-10-21 — End: 2022-10-21
  Administered 2022-10-21: 20:00:00 1 [drp] via OPHTHALMIC

## 2022-10-21 MED ORDER — BSS PLUS IO SOLN
INTRAOCULAR | Status: DC | PRN
Start: 2022-10-21 — End: 2022-10-21

## 2022-10-21 MED ORDER — EPINEPHRINE PF 1 MG/ML IJ SOLN
1 | Freq: Once | INTRAMUSCULAR | Status: DC
Start: 2022-10-21 — End: 2022-10-21

## 2022-10-21 MED ORDER — EPINEPHRINE 1 MG/ML IJ SOLN (MIXTURES ONLY)
INTRAOCULAR | Status: DC | PRN
Start: 2022-10-21 — End: 2022-10-21
  Administered 2022-10-21: 20:00:00 500 via OPHTHALMIC

## 2022-10-21 MED ORDER — PHENYLEPHRINE HCL 10 % OP SOLN
10 | OPHTHALMIC | Status: AC | PRN
Start: 2022-10-21 — End: 2022-10-21
  Administered 2022-10-21 (×3): 1 [drp] via OPHTHALMIC

## 2022-10-21 MED ORDER — TETRACAINE HCL 0.5 % OP SOLN
0.5 | OPHTHALMIC | Status: AC | PRN
Start: 2022-10-21 — End: 2022-10-21
  Administered 2022-10-21 (×3): 2 [drp] via OPHTHALMIC

## 2022-10-21 MED ORDER — LIDOCAINE HCL 3.5 % OP GEL
3.5 | Freq: Once | OPHTHALMIC | Status: AC
Start: 2022-10-21 — End: 2022-10-21
  Administered 2022-10-21: 19:00:00 via OPHTHALMIC

## 2022-10-21 MED ORDER — LACTATED RINGERS IV SOLN
INTRAVENOUS | Status: AC
Start: 2022-10-21 — End: 2022-10-21
  Administered 2022-10-21: 19:00:00 via INTRAVENOUS

## 2022-10-21 MED ORDER — NA HYALUR & NA CHOND-NA HYALUR 0.55-0.5 ML IO KIT
Freq: Once | INTRAOCULAR | Status: AC
Start: 2022-10-21 — End: 2022-10-21
  Administered 2022-10-21: 20:00:00 1.05 via INTRAOCULAR

## 2022-10-21 MED ORDER — TROPICAMIDE 1 % OP SOLN
1 | OPHTHALMIC | Status: AC | PRN
Start: 2022-10-21 — End: 2022-10-21
  Administered 2022-10-21 (×3): 1 [drp] via OPHTHALMIC

## 2022-10-21 MED ORDER — LACTATED RINGERS IV SOLN
INTRAVENOUS | Status: DC | PRN
Start: 2022-10-21 — End: 2022-10-21
  Administered 2022-10-21: 18:00:00 via INTRAVENOUS

## 2022-10-21 MED ORDER — TOBRAMYCIN-DEXAMETHASONE 0.3-0.1 % OP OINT
Freq: Once | OPHTHALMIC | Status: AC | PRN
Start: 2022-10-21 — End: 2022-10-21
  Administered 2022-10-21: 20:00:00 3.5 via OPHTHALMIC

## 2022-10-21 MED ORDER — MIDAZOLAM HCL 2 MG/2ML IJ SOLN
2 | INTRAMUSCULAR | Status: AC
Start: 2022-10-21 — End: ?

## 2022-10-21 MED ORDER — TETRACAINE HCL 0.5 % OP SOLN
0.5 | OPHTHALMIC | Status: DC | PRN
Start: 2022-10-21 — End: 2022-10-21
  Administered 2022-10-21: 20:00:00 2 [drp] via OPHTHALMIC

## 2022-10-21 MED FILL — TETRACAINE HCL 0.5 % OP SOLN: 0.5 % | OPHTHALMIC | Qty: 4

## 2022-10-21 MED FILL — DIPRIVAN 200 MG/20ML IV EMUL: 200 MG/20ML | INTRAVENOUS | Qty: 20

## 2022-10-21 MED FILL — TOBRAMYCIN-DEXAMETHASONE 0.3-0.1 % OP SUSP: OPHTHALMIC | Qty: 2.5

## 2022-10-21 MED FILL — LACTATED RINGERS IV SOLN: INTRAVENOUS | Qty: 1000

## 2022-10-21 MED FILL — AKTEN 3.5 % OP GEL: 3.5 % | OPHTHALMIC | Qty: 1

## 2022-10-21 MED FILL — EPINEPHRINE PF 1 MG/ML IJ SOLN: 1 MG/ML | INTRAMUSCULAR | Qty: 1

## 2022-10-21 MED FILL — MIDAZOLAM HCL 2 MG/2ML IJ SOLN: 2 MG/ML | INTRAMUSCULAR | Qty: 2

## 2022-10-21 MED FILL — XYLOCAINE-MPF 2 % IJ SOLN: 2 % | INTRAMUSCULAR | Qty: 5

## 2022-10-21 MED FILL — DUOVISC 0.55-0.5 ML IO KIT: INTRAOCULAR | Qty: 1

## 2022-10-21 MED FILL — PHENYLEPHRINE HCL 10 % OP SOLN: 10 % | OPHTHALMIC | Qty: 5

## 2022-10-21 MED FILL — KETOROLAC TROMETHAMINE 0.5 % OP SOLN: 0.5 % | OPHTHALMIC | Qty: 5

## 2022-10-21 MED FILL — TOBRADEX 0.3-0.1 % OP OINT: OPHTHALMIC | Qty: 3.5

## 2022-10-21 MED FILL — BSS IO SOLN: INTRAOCULAR | Qty: 500

## 2022-10-21 MED FILL — TROPICAMIDE 1 % OP SOLN: 1 % | OPHTHALMIC | Qty: 3

## 2022-10-21 NOTE — Brief Op Note (Signed)
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 Item Serial No. Manufacturer Lot No. LRB No. Used Action   LENS IOL CNA0T0.195 - [id number] Intraocular Lens LENS IOL CNA0T0.195 [id number] [employer]  Left 1 Implanted         Drains: * No LDAs found *    Findings:  Infection Present At Time Of Surgery (PATOS) (choose all levels that have infection present):  No infection present  Other Findings: CATARACT, FLOPPY IRIS SYNDROME, POOR DILATION, SLEEP APNEA    Electronically signed by Betsy Coder, MD on November 17, 2022 at 4:03 PM

## 2022-10-21 NOTE — Interval H&P Note (Signed)
Update History & Physical    The patient's History and Physical of September 07, 2022 was reviewed with the patient and I examined the patient. There was no change. The surgical site was confirmed by the patient and me.     Plan: The risks, benefits, expected outcome, and alternative to the recommended procedure have been discussed with the patient. Patient understands and wants to proceed with the procedure.     Electronically signed by Betsy Coder, MD on 10/21/2022 at 3:03 PM

## 2022-10-21 NOTE — Discharge Instructions (Signed)
[facility]      POST OPERATIVE INSTRUCTIONS AND INFORMATION         THE FIRST 24 HOURS AFTER SURGERY, YOU WILL BE ASKED TO REMAIN AT BEDREST WITH YOUR HEAD ELEVATED AT 45 DEGREES THIS CAN BE DONE BY SLEEPING ON THE PILLOWS OR IN A RECLINER.  YOU CAN GET UP AS NECESSARY.  IF AN EYE SHIELD IS PLACED, IT  MUST REMAIN FIRMLY IN PLACE FOR THE FIRST 24 HOURS.  IT WILL BE REMOVED IN THE OFFICE ON THE DAY FOLLOWING SURGERY WHEN I EXAMINE YOUR EYE.  IF YOU ARE DISCHARGED WITH SUN GLASSES, THEY ARE TO BE WORN UNTIL BEDTIME WHEN AN EYE SHIELD IS TO BE TAPED OVER THE OPERATED EYE FOR SLEEP.      YOU WILL NEED TO BRING ALL EYE MEDICATIONS TO YOUR APPOINTMENT THE DAY AFTER SURGERY.          3.   YOUR POST OP VISIT SCHEDULE IS AS FOLLOWS:             * ONE DAY AFTER SURGERY             * ONE WEEK AFTER SURGERY             * SIX WEEKS AFTER SURGERY (SPECTACLE REFRACTION AND DILATED EXAMINATION)    IT IS IMPORTANT THAT YOU WEAR EYE PROTECTION AT ALL TIMES FOR THE      FIRST WEEK AFTER EACH CATARACT SURGERY.  DURING THE DAY, YOU WILL NEED TO WEAR EITHER OUR CURRENT SPECTACLES WITH A PLAIN WINDOW GLASS LENS OR YOU MAY WISH TO PURCHASE A PAIR OF DRUG STORE BIFOCALS WITH A POWER OF +2.50 OR SO.  WHEN OUTSIDE, YOU MUST WEAR THE SOLAR SHIELDS THAT ARE PROVIDED FOR YOU.  AT BEDTIME, YOU MUST WEAR THE EYE SHIELD THAT IS ALSO PROVIDED.  AGAIN THIS IS FOR THE FIRST WEEK FOLLOWING YOUR SURGERY.    IMPORTANT DO'S AND DON'T'S:             *AVOID CONSTIPATION             *DO NOT PARTICIPATE IN SPORTS OR STRENUOUS PHYSICAL ACTIVITY INCLUDING                         SEXUAL RELATIONS             *DO NOT DRIVE FOR ONE WEEK OFTER EACH CATARACT SURGERY             *AVOID POWDERS, SPRAYS, OR OTHER THINGS THAT MIGHT GET IN YOUR EYES             *DO NOT RUB YOUR EYE OR PUT ANY PRESSURE ON YOUR EYE    IF YOUR HAIR IS WASHED BY SOMEONE ELSE, HAVE THEM POSITION YOU SO THAT YOU LEAN YOUR HEAD BACKWARDS INTO THE SINK TO AVOID SOAPY WATER FROM  GETTING IN YOUR EYES. YOU SHOULD ALSO WEAR YOUR EYE SHIELD TO PREVENT INJURY OR CONTAMINATION TO THE EYE.      DO NOT HESITATE TO CALL OUR OFFICE IF YOU FEEL SOMETHING IS NOT RIGHT OR YOU HAVE ANY QUESTIONS, UNUSUAL SYMPTOMS, OR SUDDEN CHANGES IN YOUR VISION.    OUR TELEPHONE NUMBERS:              *[place] OFFICE              [phone]              *[place] OFFICE         (  [phone]    THANK YOU FOR ENTRUSTING YOUR EYE CARE TO US.    10.  YOU HAVE RECEIVED SEDATION AS PART OF YOUR PROCEDURE.  NO DRIVING OR OPERATING HEAVY MACHINERY FOR 24 HOURS.  YOUR                       BALANCE AND JUDGEMENT MAY BE IMPAIRED.  DROWSINESS MAY ALSO OCCUR.

## 2022-10-21 NOTE — Anesthesia Post-Procedure Evaluation (Signed)
Department of Anesthesiology  Postprocedure Note    Patient: Nathan Stone  MRN: [id number]  Birthdate: 05/28/1951  Date of evaluation: 10/21/2022    Procedure Summary       Date: 10/21/22 Room / Location: [facility] MAIN OR 1 / [facility] MAIN OR    Anesthesia Start: 1519 Anesthesia Stop: 1559    Procedure: LEFT EYE EXTRACAPSULAR CATRACT REMOVAL WITH INSERTION OF INTRAOCULAR LENS IMPLANT (MAC WITH TOPICAL) (Left: Eye) Diagnosis:       Nuclear sclerotic cataract of left eye      (Nuclear sclerotic cataract of left eye [H25.12])    Surgeons: Rudean Curt, MD Responsible Provider: Cynda Acres, MD    Anesthesia Type: MAC ASA Status: 3            Anesthesia Type: No value filed.    Aldrete Phase I:      Aldrete Phase II:      Anesthesia Post Evaluation    Comments: Post-Anesthesia Evaluation and Assessment    Cardiovascular Function/Vital Signs/Pain Score  Vitals  BP: 116/76  Temp: 97.7 F (36.5 C)  Temp Source: Temporal  Pulse: 59  Respirations: 14  SpO2: 97 %  Height: 175.3 cm (5\' 9" )  Weight - Scale: 104.3 kg (230 lb)  Pain Level: 0     Patient is status post Procedure(s) with comments:  LEFT EYE EXTRACAPSULAR CATRACT REMOVAL WITH INSERTION OF INTRAOCULAR LENS IMPLANT (MAC WITH TOPICAL) - M ring used due to poor dilation and floppy irisCDE: 6.54Time:47.5 secPower: 3.7%.    Nausea/Vomiting: Controlled.    Postoperative hydration reviewed and adequate.    Pain:  Managed.    Neurological Status:   At baseline.    Mental Status and Level of Consciousness: Arousable.    Pulmonary Status:   Adequate oxygenation and airway patent.    Complications related to anesthesia: None    Post-anesthesia assessment completed. No concerns.    Patient has met all discharge requirements.    Signed By: Cynda Acres, MD    Oct 21, 2022 Post-Anesthesia Evaluation and Assessment    Cardiovascular Function/Vital Signs/Pain Score  Vitals  BP: 116/76  Temp: 97.7 F (36.5 C)  Temp Source: Temporal  Pulse: 59  Respirations: 14  SpO2: 97 %  Height:  175.3 cm (5\' 9" )  Weight - Scale: 104.3 kg (230 lb)  Pain Level: 0     Patient is status post Procedure(s) with comments:  LEFT EYE EXTRACAPSULAR CATRACT REMOVAL WITH INSERTION OF INTRAOCULAR LENS IMPLANT (MAC WITH TOPICAL) - M ring used due to poor dilation and floppy irisCDE: 6.54Time:47.5 secPower: 3.7%.    Nausea/Vomiting: Controlled.    Postoperative hydration reviewed and adequate.    Pain:  Managed.    Neurological Status:   At baseline.    Mental Status and Level of Consciousness: Arousable.    Pulmonary Status:   Adequate oxygenation and airway patent.    Complications related to anesthesia: None    Post-anesthesia assessment completed. No concerns.    Patient has met all discharge requirements.    Signed By: Cynda Acres, MD    Oct 21, 2022     No notable events documented.

## 2022-10-21 NOTE — Progress Notes (Signed)
Patient meets criteria for discharge - vital signs return to baseline. Denies pain and nausea. Tolerated fluids. Discharge instructions given to patient and patients wife - opportunity given for questions - verbalized understanding.

## 2022-10-22 MED ORDER — BALANCED SALT IO SOLN
INTRAOCULAR | Status: DC | PRN
Start: 2022-10-22 — End: 2022-10-22
  Administered 2022-10-21: 20:00:00 30

## 2023-06-09 ENCOUNTER — Ambulatory Visit: Payer: Medicare Other

## 2023-06-09 DIAGNOSIS — K222 Esophageal obstruction: Secondary | ICD-10-CM | POA: Diagnosis not present

## 2023-06-09 DIAGNOSIS — K2950 Unspecified chronic gastritis without bleeding: Secondary | ICD-10-CM | POA: Diagnosis not present

## 2023-06-09 DIAGNOSIS — K5730 Diverticulosis of large intestine without perforation or abscess without bleeding: Secondary | ICD-10-CM | POA: Diagnosis not present

## 2023-06-09 DIAGNOSIS — B9681 Helicobacter pylori [H. pylori] as the cause of diseases classified elsewhere: Secondary | ICD-10-CM | POA: Diagnosis not present

## 2023-06-09 DIAGNOSIS — K640 First degree hemorrhoids: Secondary | ICD-10-CM | POA: Diagnosis not present

## 2023-06-09 DIAGNOSIS — D123 Benign neoplasm of transverse colon: Secondary | ICD-10-CM | POA: Diagnosis present

## 2023-06-09 DIAGNOSIS — K449 Diaphragmatic hernia without obstruction or gangrene: Secondary | ICD-10-CM | POA: Diagnosis not present

## 2023-07-03 NOTE — Telephone Encounter (Signed)
Spoke with patient spouse to set up a sleep consolation for snoring.

## 2023-11-13 ENCOUNTER — Ambulatory Visit: Payer: Medicare (Managed Care) | Attending: Internal Medicine

## 2023-11-19 NOTE — Telephone Encounter (Signed)
 Reached out to Mr. Ned Balint, per fax from Ala How, MD [phone] - who requested Mr. Cu set up appointment for sleep study.  Mr. Vanness sister answered and said that he has moved to [place]  and he will be getting in contact with Dr's out there for his medical needs.

## 2024-02-26 ENCOUNTER — Ambulatory Visit: Payer: Medicare (Managed Care) | Attending: Neurology
